# Patient Record
Sex: Male | Born: 2000
Health system: Southern US, Community
[De-identification: ages and names within clinical notes are randomized; demographics above are authoritative.]

## PROBLEM LIST (undated history)

## (undated) DIAGNOSIS — J45909 Unspecified asthma, uncomplicated: Secondary | ICD-10-CM

---
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
## 2018-02-07 ENCOUNTER — Encounter (HOSPITAL_COMMUNITY): Payer: Self-pay

## 2018-02-07 ENCOUNTER — Other Ambulatory Visit: Payer: Self-pay

## 2018-02-07 ENCOUNTER — Emergency Department (HOSPITAL_COMMUNITY)
Admission: EM | Admit: 2018-02-07 | Discharge: 2018-02-07 | Disposition: A | Discharge status: Home / Self Care | Payer: Medicaid Other | Attending: Emergency Medicine | Admitting: Emergency Medicine

## 2018-02-07 DIAGNOSIS — R1031 Right lower quadrant pain: Secondary | ICD-10-CM | POA: Insufficient documentation

## 2018-02-07 DIAGNOSIS — R109 Unspecified abdominal pain: Secondary | ICD-10-CM | POA: Diagnosis present

## 2018-02-07 DIAGNOSIS — J45909 Unspecified asthma, uncomplicated: Secondary | ICD-10-CM | POA: Diagnosis not present

## 2018-02-07 DIAGNOSIS — F17210 Nicotine dependence, cigarettes, uncomplicated: Secondary | ICD-10-CM | POA: Insufficient documentation

## 2018-02-07 DIAGNOSIS — Z79899 Other long term (current) drug therapy: Secondary | ICD-10-CM | POA: Insufficient documentation

## 2018-02-07 DIAGNOSIS — K5900 Constipation, unspecified: Secondary | ICD-10-CM | POA: Diagnosis not present

## 2018-02-07 HISTORY — DX: Unspecified asthma, uncomplicated: J45.909

## 2018-02-07 LAB — COMPREHENSIVE METABOLIC PANEL
ALT: 20 U/L (ref 0–44)
ANION GAP: 8 (ref 5–15)
AST: 18 U/L (ref 15–41)
Albumin: 4.2 g/dL (ref 3.5–5.0)
Alkaline Phosphatase: 56 U/L (ref 38–126)
BUN: 12 mg/dL (ref 6–20)
CO2: 26 mmol/L (ref 22–32)
Calcium: 9.1 mg/dL (ref 8.9–10.3)
Chloride: 106 mmol/L (ref 98–111)
Creatinine, Ser: 0.89 mg/dL (ref 0.61–1.24)
GFR calc Af Amer: >60 mL/min (ref >60)
Glucose, Bld: 94 mg/dL (ref 70–99)
Potassium: 4.1 mmol/L (ref 3.5–5.1)
Sodium: 140 mmol/L (ref 135–145)
TOTAL PROTEIN: 6.9 g/dL (ref 6.5–8.1)
Total Bilirubin: 0.8 mg/dL (ref 0.3–1.2)

## 2018-02-07 LAB — CBC
HEMATOCRIT: 43.3 % (ref 39.0–52.0)
Hemoglobin: 14.4 g/dL (ref 13.0–17.0)
MCH: 28.6 pg (ref 26.0–34.0)
MCHC: 33.3 g/dL (ref 30.0–36.0)
MCV: 86.1 fL (ref 78.0–100.0)
Platelets: 206 10*3/uL (ref 150–400)
RBC: 5.03 MIL/uL (ref 4.22–5.81)
RDW: 12.4 % (ref 11.5–15.5)
WBC: 10.8 10*3/uL — AB (ref 4.0–10.5)

## 2018-02-07 LAB — LIPASE, BLOOD: Lipase: 29 U/L (ref 11–51)

## 2018-02-07 MED ORDER — POLYETHYLENE GLYCOL 3350 17 G PO PACK: 17.0000 g | PACK | Freq: Every day | ORAL | 0 refills | Status: AC

## 2018-02-07 MED ORDER — POLYETHYLENE GLYCOL 3350 17 G PO PACK: 17.0000 g | PACK | Freq: Every day | ORAL | 0 refills | Status: DC

## 2018-02-07 NOTE — ED Triage Notes (Signed)
[facility] [place] [facility] [place] pain, abdominal pain, and fever. Pt reports he has been constipated X3 days with pain starting today.

## 2018-02-07 NOTE — ED Provider Notes (Signed)
[facility]   CSN: [id number] Arrival date & time: 02/07/18  1216     History   Chief Complaint Chief Complaint  Patient presents with  . Abdominal Pain  . Back Pain    HPI Edgar Walton is a 17 y.o. male.  HPI   17 year old male presents today with complaints of abdominal pain. Patient notes over the last 3 days he's been passing gas but has had very little bowel movements. He notes having a very small bowel movement yesterday. Patient notes he felt nauseous and vomited once. He denies any fever, notes the pain is bilateral lower. He denies any urinary symptoms. Prior to my evaluation patient reports that he had a large bowel movement and had significant improvement in his symptoms.    Past Medical History:  Diagnosis Date  . Asthma     There are no active problems to display for this patient.   History reviewed. No pertinent surgical history.      Home Medications    Prior to Admission medications   Medication Sig Start Date End Date Taking? Authorizing Provider  albuterol (PROAIR HFA) 108 (90 Base) MCG/ACT inhaler Inhale 1 puff into the lungs every 4 (four) hours as needed for wheezing or shortness of breath. 10/31/17  Yes [provider]  polyethylene glycol (MIRALAX) packet Take 17 g by mouth daily. 02/07/18   Eyvonne MechanicHedges, Nicholette Dolson, PA-C    Family History No family history on file.  Social History Social History   Tobacco Use  . Smoking status: Heavy Tobacco Smoker    Packs/day: 1.00    Types: Cigarettes  . Smokeless tobacco: Never Used  Substance Use Topics  . Alcohol use: No  . Drug use: No     Allergies   Bee venom; Morphine and related; Butterscotch flavor; and Penicillins   Review of Systems Review of Systems  All other systems reviewed and are negative.    Physical Exam Updated Vital Signs BP (!) 156/71   Pulse 89   Temp 98.9 F (37.2 C) (Oral)   Resp 18   Ht 6' (1.829 m)    Wt 95.7 kg (211 lb)   SpO2 100%   BMI 28.62 kg/m   Physical Exam  Constitutional: He is oriented to person, place, and time. He appears well-developed and well-nourished.  HENT:  Head: Normocephalic and atraumatic.  Eyes: Pupils are equal, round, and reactive to light. Conjunctivae are normal. Right eye exhibits no discharge. Left eye exhibits no discharge. No scleral icterus.  Neck: Normal range of motion. No JVD present. No tracheal deviation present.  Pulmonary/Chest: Effort normal. No stridor.  Abdominal:  Minor tenderness to palpation of the suprapubic and right lower quadrant remainder abdomen soft nontender  Neurological: He is alert and oriented to person, place, and time. Coordination normal.  Psychiatric: He has a normal mood and affect. His behavior is normal. Judgment and thought content normal.  Nursing note and vitals reviewed.    ED Treatments / Results  Labs (all labs ordered are listed, but only abnormal results are displayed) Labs Reviewed  CBC - Abnormal; Notable for the following components:      Result Value   WBC 10.8 (*)    All other components within normal limits  LIPASE, BLOOD  COMPREHENSIVE METABOLIC PANEL    EKG None  Radiology No results found.  Procedures Procedures (including critical care time)  Medications Ordered in ED Medications - No data to display   Initial  Impression / Assessment and Plan / ED Course  I have reviewed the triage vital signs and the nursing notes.  Pertinent labs & imaging results that were available during my care of the patient were reviewed by me and considered in my medical decision making (see chart for details).     17 year old male presents today with complaints of abdominal pain. Suspicion for constipation. Patient did have an episode of vomiting and does have very minimal tenderness to palpation of the lower abdomen. Patient had dramatic improvement in his symptoms after having a bowel movement, with  the nausea vomiting and pain recommend CT scan to rule out acute intra-abdominal pathology. Patient would like to leave and does not feel it is necessary. Patient does not have a fever or tachycardia or elevation white count.  he is stable for outpatient management, he'll return tomorrow if symptoms persist immediately if they worsen. He verbalized understanding and agreement to today's plan had no further questions or concerns  Final Clinical Impressions(s) / ED Diagnoses   Final diagnoses:  Right lower quadrant abdominal pain  Constipation, unspecified constipation type    ED Discharge Orders        Ordered    polyethylene glycol (MIRALAX) packet  Daily,   Status:  Discontinued     02/07/18 1428    polyethylene glycol (MIRALAX) packet  Daily     02/07/18 1430       Eyvonne Mechanic, PA-C 02/07/18 2130    Samuel Jester, DO 02/10/18 1523

## 2018-02-07 NOTE — Discharge Instructions (Addendum)
Please read the attached information, please return tomorrow if your symptoms persist or sooner if they worsen. Please drink plenty of water and take prescribed medication as directed.

## 2018-02-07 NOTE — ED Notes (Signed)
Discharge instructions discussed with Pt. Pt verbalized understanding. Pt stable and ambulatory.    

## 2018-02-27 ENCOUNTER — Emergency Department (HOSPITAL_COMMUNITY): Payer: Medicaid Other

## 2018-02-27 ENCOUNTER — Other Ambulatory Visit: Payer: Self-pay

## 2018-02-27 ENCOUNTER — Emergency Department (HOSPITAL_COMMUNITY)
Admission: EM | Admit: 2018-02-27 | Discharge: 2018-02-27 | Disposition: A | Discharge status: Home / Self Care | Payer: Medicaid Other | Attending: Emergency Medicine | Admitting: Emergency Medicine

## 2018-02-27 ENCOUNTER — Encounter (HOSPITAL_COMMUNITY): Payer: Self-pay | Admitting: *Deleted

## 2018-02-27 DIAGNOSIS — R1030 Lower abdominal pain, unspecified: Secondary | ICD-10-CM

## 2018-02-27 DIAGNOSIS — J45909 Unspecified asthma, uncomplicated: Secondary | ICD-10-CM | POA: Insufficient documentation

## 2018-02-27 DIAGNOSIS — K5900 Constipation, unspecified: Secondary | ICD-10-CM | POA: Insufficient documentation

## 2018-02-27 DIAGNOSIS — Z79899 Other long term (current) drug therapy: Secondary | ICD-10-CM | POA: Insufficient documentation

## 2018-02-27 DIAGNOSIS — F17210 Nicotine dependence, cigarettes, uncomplicated: Secondary | ICD-10-CM | POA: Insufficient documentation

## 2018-02-27 DIAGNOSIS — K5909 Other constipation: Secondary | ICD-10-CM

## 2018-02-27 LAB — COMPREHENSIVE METABOLIC PANEL
ALK PHOS: 49 U/L (ref 38–126)
ALT: 19 U/L (ref 0–44)
ANION GAP: 6 (ref 5–15)
AST: 18 U/L (ref 15–41)
Albumin: 4.3 g/dL (ref 3.5–5.0)
BUN: 13 mg/dL (ref 6–20)
CALCIUM: 9.2 mg/dL (ref 8.9–10.3)
CHLORIDE: 106 mmol/L (ref 98–111)
CO2: 28 mmol/L (ref 22–32)
Creatinine, Ser: 0.78 mg/dL (ref 0.61–1.24)
GFR calc non Af Amer: >60 mL/min (ref >60)
GLUCOSE: 110 mg/dL — AB (ref 70–99)
POTASSIUM: 3.8 mmol/L (ref 3.5–5.1)
SODIUM: 140 mmol/L (ref 135–145)
Total Bilirubin: 0.7 mg/dL (ref 0.3–1.2)
Total Protein: 7.1 g/dL (ref 6.5–8.1)

## 2018-02-27 LAB — URINALYSIS, ROUTINE W REFLEX MICROSCOPIC
BILIRUBIN URINE: NEGATIVE
Glucose, UA: NEGATIVE mg/dL
Hgb urine dipstick: NEGATIVE
Ketones, ur: NEGATIVE mg/dL
Leukocytes, UA: NEGATIVE
NITRITE: NEGATIVE
Protein, ur: NEGATIVE mg/dL
Specific Gravity, Urine: 1.027 (ref 1.005–1.030)
pH: 5 (ref 5.0–8.0)

## 2018-02-27 LAB — CBC WITH DIFFERENTIAL/PLATELET
BASOS PCT: 0 %
Basophils Absolute: 0 10*3/uL (ref 0.0–0.1)
EOS PCT: 3 %
Eosinophils Absolute: 0.2 10*3/uL (ref 0.0–0.7)
HCT: 39.2 % (ref 39.0–52.0)
HEMOGLOBIN: 13.5 g/dL (ref 13.0–17.0)
Lymphocytes Relative: 41 %
Lymphs Abs: 2.5 10*3/uL (ref 0.7–4.0)
MCH: 29.2 pg (ref 26.0–34.0)
MCHC: 34.4 g/dL (ref 30.0–36.0)
MCV: 84.8 fL (ref 78.0–100.0)
MONOS PCT: 7 %
Monocytes Absolute: 0.4 10*3/uL (ref 0.1–1.0)
NEUTROS PCT: 49 %
Neutro Abs: 3 10*3/uL (ref 1.7–7.7)
Platelets: 217 10*3/uL (ref 150–400)
RBC: 4.62 MIL/uL (ref 4.22–5.81)
RDW: 12.7 % (ref 11.5–15.5)
WBC: 6.1 10*3/uL (ref 4.0–10.5)

## 2018-02-27 LAB — LIPASE, BLOOD: Lipase: 27 U/L (ref 11–51)

## 2018-02-27 MED ORDER — PEG 3350-KCL-NABCB-NACL-NASULF 236 G PO SOLR: 4000.0000 mL | Freq: Once | ORAL | 0 refills | Status: AC

## 2018-02-27 NOTE — ED Provider Notes (Signed)
[facility] EMERGENCY DEPARTMENT Provider Note   CSN: [id number] Arrival date & time: 02/27/18  0123     History   Chief Complaint Chief Complaint  Patient presents with  . Abdominal Pain    HPI Edgar Walton is a 17 y.o. male.  HPI  This is a 17 year old male who presents with abdominal pain.  Patient was seen and evaluated on August 1 for the same.  He reports lower abdominal pain and pressure.  Mostly with bowel movements.  Patient does report that he has had bowel movements but has noticed that they are loose and "not normal."  He denies any bloody stools.  He has noted pressure in his rectum with stooling.  Currently his pain is 2 out of 10.  Denies any urinary symptoms, fevers, nausea, vomiting.  Patient reports that he took MiraLAX which initially seemed to help but he has not taken MiraLAX recently.  He has never had any problems with bowel movements in the past.  Past Medical History:  Diagnosis Date  . Asthma     There are no active problems to display for this patient.   History reviewed. No pertinent surgical history.      Home Medications    Prior to Admission medications   Medication Sig Start Date End Date Taking? Authorizing Provider  albuterol (PROAIR HFA) 108 (90 Base) MCG/ACT inhaler Inhale 1 puff into the lungs every 4 (four) hours as needed for wheezing or shortness of breath. 10/31/17   [provider]  polyethylene glycol (GOLYTELY) 236 g solution Take 4,000 mLs by mouth once for 1 dose. Take 8 oz every 15-30 min until finished 02/27/18 02/27/18  Elisheva Fallas, Mayer Maskerourtney F, MD  polyethylene glycol [facility](MIRALAX) packet Take 17 g by mouth daily. 02/07/18   Eyvonne MechanicHedges, Jeffrey, PA-C    Family History History reviewed. No pertinent family history.  Social History Social History   Tobacco Use  . Smoking status: Heavy Tobacco Smoker    Packs/day: 1.00    Types: Cigarettes  . Smokeless tobacco: Never Used  Substance Use Topics  . Alcohol use: No  .  Drug use: No     Allergies   Bee venom; Morphine and related; Butterscotch flavor; and Penicillins   Review of Systems Review of Systems  Constitutional: Negative for fever.  Respiratory: Negative for shortness of breath.   Cardiovascular: Negative for chest pain.  Gastrointestinal: Positive for abdominal pain. Negative for blood in stool, nausea and vomiting.  Genitourinary: Negative for dysuria.  All other systems reviewed and are negative.    Physical Exam Updated Vital Signs BP (!) 144/86 (BP Location: Right Arm)   Pulse 64   Temp 98.1 F (36.7 C) (Oral)   Resp 18   Ht 6' (1.829 m)   Wt 95.7 kg   SpO2 100%   BMI 28.62 kg/m   Physical Exam  Constitutional: He is oriented to person, place, and time. He appears well-developed and well-nourished. No distress.  HENT:  Head: Normocephalic and atraumatic.  Eyes: Pupils are equal, round, and reactive to light.  Neck: Neck supple.  Cardiovascular: Normal rate, regular rhythm and normal heart sounds.  No murmur heard. Pulmonary/Chest: Effort normal and breath sounds normal. No respiratory distress. He has no wheezes.  Abdominal: Soft. Bowel sounds are normal. There is no tenderness. There is no rebound and no guarding.  Genitourinary: Rectum normal. Rectal exam shows no mass and no tenderness.  Genitourinary Comments: No fecal impaction noted  Musculoskeletal: He exhibits no edema.  Lymphadenopathy:    He has no cervical adenopathy.  Neurological: He is alert and oriented to person, place, and time.  Skin: Skin is warm and dry.  Psychiatric: He has a normal mood and affect.  Nursing note and vitals reviewed.    ED Treatments / Results  Labs (all labs ordered are listed, but only abnormal results are displayed) Labs Reviewed  COMPREHENSIVE METABOLIC PANEL - Abnormal; Notable for the following components:      Result Value   Glucose, Bld 110 (*)    All other components within normal limits  CBC WITH  DIFFERENTIAL/PLATELET  LIPASE, BLOOD  URINALYSIS, ROUTINE W REFLEX MICROSCOPIC    EKG None  Radiology Dg Abdomen 1 View  Result Date: 02/27/2018 CLINICAL DATA:  Lower abdominal pain. EXAM: ABDOMEN - 1 VIEW COMPARISON:  None. FINDINGS: No bowel dilatation to suggest obstruction. Moderate stool in the right proximal transverse colon. Small to moderate stool in the distal descending colon. No abnormal rectal distention. No radiopaque calculi or abnormal soft tissue calcifications. No osseous abnormalities. IMPRESSION: Moderate colonic stool burden without bowel obstruction. Electronically Signed   By: Rubye OaksMelanie  Ehinger M.D.   On: 02/27/2018 02:19    Procedures Procedures (including critical care time)  Medications Ordered in ED Medications - No data to display   Initial Impression / Assessment and Plan / ED Course  I have reviewed the triage vital signs and the nursing notes.  Pertinent labs & imaging results that were available during my care of the patient were reviewed by me and considered in my medical decision making (see chart for details).     Patient presents with persistent lower abdominal discomfort and bowel changes.  He is overall nontoxic-appearing and vital signs are reassuring.  He was seen over 2 weeks ago but felt to be constipated.  He did try laxative once which seemed to help but has not been consistently use.  Abdominal exam is benign.  No focal tenderness.  Rectal exam without fecal impaction.  His KUB does show evidence of moderate stool burden.  Lab work repeated and reviewed.  Lab work-up is largely unremarkable.  Doubt appendicitis.  Patient does report some history of lactose intolerance but continues to eat cheese.  Recommend increasing fiber in diet.  Patient was offered options of increasing MiraLAX for cleanout versus GoLYTELY.  Patient chose GoLYTELY.  He was instructed on use to attempt full cleanout.  After history, exam, and medical workup I feel the  patient has been appropriately medically screened and is safe for discharge home. Pertinent diagnoses were discussed with the patient. Patient was given return precautions.   Final Clinical Impressions(s) / ED Diagnoses   Final diagnoses:  Other constipation  Lower abdominal pain    ED Discharge Orders         Ordered    polyethylene glycol (GOLYTELY) 236 g solution   Once     02/27/18 0315           Slyvia Lartigue, Mayer Maskerourtney F, MD 02/27/18 [phone]

## 2018-02-27 NOTE — ED Triage Notes (Signed)
Pt c/o abdominal pain with dark stools x 3-4 weeks; pt states he was seen at [facility] x 3 weeks ago for the same complaint; pt's last BM was today

## 2018-06-17 ENCOUNTER — Other Ambulatory Visit: Payer: Self-pay

## 2018-06-17 ENCOUNTER — Emergency Department (HOSPITAL_COMMUNITY)
Admission: EM | Admit: 2018-06-17 | Discharge: 2018-06-18 | Disposition: A | Discharge status: Home / Self Care | Payer: Medicaid Other | Attending: Emergency Medicine | Admitting: Emergency Medicine

## 2018-06-17 ENCOUNTER — Emergency Department (HOSPITAL_COMMUNITY): Payer: Medicaid Other

## 2018-06-17 ENCOUNTER — Encounter (HOSPITAL_COMMUNITY): Payer: Self-pay | Admitting: Emergency Medicine

## 2018-06-17 DIAGNOSIS — M25561 Pain in right knee: Secondary | ICD-10-CM | POA: Insufficient documentation

## 2018-06-17 DIAGNOSIS — J45909 Unspecified asthma, uncomplicated: Secondary | ICD-10-CM | POA: Insufficient documentation

## 2018-06-17 DIAGNOSIS — F17210 Nicotine dependence, cigarettes, uncomplicated: Secondary | ICD-10-CM | POA: Insufficient documentation

## 2018-06-17 DIAGNOSIS — Z79899 Other long term (current) drug therapy: Secondary | ICD-10-CM | POA: Insufficient documentation

## 2018-06-17 NOTE — ED Triage Notes (Signed)
Pt reports rt knee pain for the last few weeks intermittently. Pt ambulatory. Denies any injuries.

## 2018-06-18 MED ORDER — NAPROXEN 500 MG PO TABS: 500.0000 mg | ORAL_TABLET | Freq: Two times a day (BID) | ORAL | 0 refills | Status: AC

## 2018-06-18 MED ORDER — NAPROXEN 500 MG PO TABS: 500.0000 mg | ORAL_TABLET | Freq: Two times a day (BID) | ORAL | 0 refills | Status: DC

## 2018-06-18 MED ORDER — NAPROXEN 250 MG PO TABS
500.0000 mg | ORAL_TABLET | Freq: Once | ORAL | Status: AC
  Administered 2018-06-18: 500 mg via ORAL
  Filled 2018-06-18: qty 2

## 2018-06-18 NOTE — ED Notes (Signed)
Patient verbalizes understanding of medications and discharge instructions. No further questions at this time. VSS and patient ambulatory at discharge.   

## 2018-06-18 NOTE — ED Provider Notes (Signed)
[facility] Provider Note   CSN: [id number] Arrival date & time: 06/17/18  2312     History   Chief Complaint Chief Complaint  Patient presents with  . Knee Pain    HPI Edgar Walton is a 17 y.o. male.  The history is provided by the patient. No language interpreter was used.  Knee Pain   This is a new problem. Episode onset: 2 weeks ago. The problem occurs constantly. The problem has not changed since onset.The pain is present in the right knee. The quality of the pain is described as aching and sharp. The pain is moderate. Pertinent negatives include no tingling. Exacerbated by: ambulation. Treatments tried: tylenol, ibuprofen. The treatment provided mild relief. There has been no history of extremity trauma.    Past Medical History:  Diagnosis Date  . Asthma     There are no active problems to display for this patient.   History reviewed. No pertinent surgical history.      Home Medications    Prior to Admission medications   Medication Sig Start Date End Date Taking? Authorizing Provider  albuterol (PROAIR HFA) 108 (90 Base) MCG/ACT inhaler Inhale 1 puff into the lungs every 4 (four) hours as needed for wheezing or shortness of breath. 10/31/17   [provider]  naproxen (NAPROSYN) 500 MG tablet Take 1 tablet (500 mg total) by mouth 2 (two) times daily. 06/18/18   Antony MaduraHumes, Louis Ivery, PA-C  polyethylene glycol [facility](MIRALAX) packet Take 17 g by mouth daily. 02/07/18   Eyvonne MechanicHedges, Jeffrey, PA-C    Family History No family history on file.  Social History Social History   Tobacco Use  . Smoking status: Heavy Tobacco Smoker    Packs/day: 0.50    Types: Cigarettes  . Smokeless tobacco: Never Used  Substance Use Topics  . Alcohol use: Yes    Comment: occ  . Drug use: No     Allergies   Bee venom; Morphine and related; Butterscotch flavor; and Penicillins   Review of Systems Review of Systems  Musculoskeletal:  Positive for arthralgias.  Neurological: Negative for tingling.  Ten systems reviewed and are negative for acute change, except as noted in the HPI.    Physical Exam Updated Vital Signs BP 140/89 (BP Location: Right Arm)   Pulse 75   Temp 98.4 F (36.9 C) (Oral)   Resp 17   Ht 6' (1.829 m)   Wt 95.3 kg   SpO2 100%   BMI 28.48 kg/m   Physical Exam  Constitutional: He is oriented to person, place, and time. He appears well-developed and well-nourished. No distress.  Nontoxic appearing and in NAD  HENT:  Head: Normocephalic and atraumatic.  Eyes: Conjunctivae and EOM are normal. No scleral icterus.  Neck: Normal range of motion.  Cardiovascular: Normal rate, regular rhythm and intact distal pulses.  DP pulse 2+ in the RLE  Pulmonary/Chest: Effort normal. No respiratory distress.  Respirations even and unlabored  Musculoskeletal: Normal range of motion.  TTP to the right knee posteriorly and along bilateral joint lines. Normal flexion and extension of the R knee without bony deformity or crepitus. No erythema, swelling, heat to touch.  Neurological: He is alert and oriented to person, place, and time. He exhibits normal muscle tone. Coordination normal.  Sensation to light touch intact.  Skin: Skin is warm and dry. No rash noted. He is not diaphoretic. No erythema. No pallor.  Psychiatric: He has a normal mood and affect. His behavior  is normal.  Nursing note and vitals reviewed.    ED Treatments / Results  Labs (all labs ordered are listed, but only abnormal results are displayed) Labs Reviewed - No data to display  EKG None  Radiology Dg Knee Complete 4 Views Right  Result Date: 06/17/2018 CLINICAL DATA:  Intermittent right knee pain for the past 2 weeks. No known injury. EXAM: RIGHT KNEE - COMPLETE 4+ VIEW COMPARISON:  None. FINDINGS: No evidence of fracture, dislocation, or joint effusion. No evidence of arthropathy or other focal bone abnormality. Soft tissues are  unremarkable. IMPRESSION: Normal examination. Electronically Signed   By: Beckie Salts M.D.   On: 06/17/2018 23:48    Procedures Procedures (including critical care time)  Medications Ordered in ED Medications  naproxen (NAPROSYN) tablet 500 mg (500 mg Oral Given 06/18/18 0159)     Initial Impression / Assessment and Plan / ED Course  I have reviewed the triage vital signs and the nursing notes.  Pertinent labs & imaging results that were available during my care of the patient were reviewed by me and considered in my medical decision making (see chart for details).     Patient presents to the emergency department for evaluation of R knee pain. Patient neurovascularly intact on exam. Imaging negative for fracture, dislocation, bony deformity. No swelling, erythema, heat to touch to the affected area; no concern for septic joint. Compartments in the affected extremity are soft. Plan for supportive management including RICE and NSAIDs; primary care follow up as needed. Return precautions discussed and provided. Patient discharged in stable condition with no unaddressed concerns.   Final Clinical Impressions(s) / ED Diagnoses   Final diagnoses:  Acute pain of right knee    ED Discharge Orders         Ordered    naproxen (NAPROSYN) 500 MG tablet  2 times daily     06/18/18 0155           Antony Madura, PA-C 06/18/18 3244    Zadie Rhine, MD 06/18/18 [phone]

## 2018-08-08 ENCOUNTER — Emergency Department (HOSPITAL_COMMUNITY): Payer: Medicaid Other

## 2018-08-08 ENCOUNTER — Other Ambulatory Visit: Payer: Self-pay

## 2018-08-08 ENCOUNTER — Encounter (HOSPITAL_COMMUNITY): Payer: Self-pay | Admitting: Emergency Medicine

## 2018-08-08 ENCOUNTER — Emergency Department (HOSPITAL_COMMUNITY)
Admission: EM | Admit: 2018-08-08 | Discharge: 2018-08-09 | Disposition: A | Discharge status: Home / Self Care | Payer: Medicaid Other | Attending: Emergency Medicine | Admitting: Emergency Medicine

## 2018-08-08 DIAGNOSIS — J069 Acute upper respiratory infection, unspecified: Secondary | ICD-10-CM

## 2018-08-08 DIAGNOSIS — R0789 Other chest pain: Secondary | ICD-10-CM | POA: Insufficient documentation

## 2018-08-08 DIAGNOSIS — F17210 Nicotine dependence, cigarettes, uncomplicated: Secondary | ICD-10-CM | POA: Insufficient documentation

## 2018-08-08 LAB — BASIC METABOLIC PANEL
Anion gap: 9 (ref 5–15)
BUN: 13 mg/dL (ref 6–20)
CO2: 25 mmol/L (ref 22–32)
Calcium: 9.7 mg/dL (ref 8.9–10.3)
Chloride: 105 mmol/L (ref 98–111)
Creatinine, Ser: 0.79 mg/dL (ref 0.61–1.24)
GFR calc Af Amer: >60 mL/min (ref >60)
Glucose, Bld: 116 mg/dL — ABNORMAL HIGH (ref 70–99)
Potassium: 3.5 mmol/L (ref 3.5–5.1)
SODIUM: 139 mmol/L (ref 135–145)

## 2018-08-08 LAB — CBC
HCT: 42.8 % (ref 39.0–52.0)
HEMOGLOBIN: 14.4 g/dL (ref 13.0–17.0)
MCH: 27.8 pg (ref 26.0–34.0)
MCHC: 33.6 g/dL (ref 30.0–36.0)
MCV: 82.6 fL (ref 80.0–100.0)
Platelets: 333 10*3/uL (ref 150–400)
RBC: 5.18 MIL/uL (ref 4.22–5.81)
RDW: 12.8 % (ref 11.5–15.5)
WBC: 7.3 10*3/uL (ref 4.0–10.5)
nRBC: 0 % (ref 0.0–0.2)

## 2018-08-08 LAB — I-STAT TROPONIN, ED: Troponin i, poc: 0 ng/mL (ref 0.00–0.08)

## 2018-08-08 MED ORDER — SODIUM CHLORIDE 0.9% FLUSH: 3.0000 mL | Freq: Once | INTRAVENOUS | Status: DC

## 2018-08-08 NOTE — ED Triage Notes (Signed)
Pt here for chest pains, pt has had URI symptoms and coughing up blood for 1 week.

## 2018-08-09 MED ORDER — BENZONATATE 100 MG PO CAPS: 200.0000 mg | ORAL_CAPSULE | Freq: Two times a day (BID) | ORAL | 0 refills | Status: AC | PRN

## 2018-08-09 NOTE — ED Provider Notes (Signed)
[facility] EMERGENCY DEPARTMENT Provider Note   CSN: [id number] Arrival date & time: 08/08/18  1857     History   Chief Complaint Chief Complaint  Patient presents with  . Chest Pain    HPI Edgar Walton is a 18 y.o. male.  Patient with past medical history remarkable for asthma presents to the emergency department with a chief complaint of cough.  He states that he has had some blood-tinged sputum.  Reports having frequent coughing at night.  Denies any fevers chills.  States that he has been sick for about 1 week.  He has been using an inhaler and nebulizer as needed.  He has tried OTC cough and cold medications with little relief.  He denies any prior history of PE or DVT.  Denies any recent surgery or immobilization.  Denies any leg swelling or calf pain.  He states that the coughing causes his chest to hurt, and keeps him awake at night.  The history is provided by the patient. No language interpreter was used.    Past Medical History:  Diagnosis Date  . Asthma     There are no active problems to display for this patient.   History reviewed. No pertinent surgical history.      Home Medications    Prior to Admission medications   Medication Sig Start Date End Date Taking? Authorizing Provider  albuterol (PROAIR HFA) 108 (90 Base) MCG/ACT inhaler Inhale 1 puff into the lungs every 4 (four) hours as needed for wheezing or shortness of breath. 10/31/17   [provider]  benzonatate (TESSALON) 100 MG capsule Take 2 capsules (200 mg total) by mouth 2 (two) times daily as needed for cough. 08/09/18   Roxy Horseman, PA-C  naproxen (NAPROSYN) 500 MG tablet Take 1 tablet (500 mg total) by mouth 2 (two) times daily. 06/18/18   Antony Madura, PA-C  polyethylene glycol [facility]) packet Take 17 g by mouth daily. 02/07/18   Eyvonne Mechanic, PA-C    Family History No family history on file.  Social History Social History   Tobacco Use  .  Smoking status: Heavy Tobacco Smoker    Packs/day: 0.50    Types: Cigarettes  . Smokeless tobacco: Never Used  Substance Use Topics  . Alcohol use: Yes    Comment: occ  . Drug use: No     Allergies   Bee venom; Morphine and related; Butterscotch flavor; and Penicillins   Review of Systems Review of Systems  All other systems reviewed and are negative.    Physical Exam Updated Vital Signs BP 131/90   Pulse 77   Temp 98.7 F (37.1 C) (Oral)   Resp (!) 23   SpO2 98%   Physical Exam Vitals signs and nursing note reviewed.  Constitutional:      Appearance: He is well-developed.  HENT:     Head: Normocephalic and atraumatic.  Eyes:     General: No scleral icterus.       Right eye: No discharge.        Left eye: No discharge.     Conjunctiva/sclera: Conjunctivae normal.     Pupils: Pupils are equal, round, and reactive to light.  Neck:     Musculoskeletal: Normal range of motion and neck supple.     Vascular: No JVD.  Cardiovascular:     Rate and Rhythm: Normal rate and regular rhythm.     Heart sounds: Normal heart sounds. No murmur. No friction rub. No gallop.  Pulmonary:     Effort: Pulmonary effort is normal. No respiratory distress.     Breath sounds: Normal breath sounds. No wheezing or rales.     Comments: CTAB Chest:     Chest wall: No tenderness.  Abdominal:     General: There is no distension.     Palpations: Abdomen is soft. There is no mass.     Tenderness: There is no abdominal tenderness. There is no guarding or rebound.  Musculoskeletal: Normal range of motion.        General: No tenderness.  Skin:    General: Skin is warm and dry.  Neurological:     Mental Status: He is alert and oriented to person, place, and time.  Psychiatric:        Behavior: Behavior normal.        Thought Content: Thought content normal.        Judgment: Judgment normal.      ED Treatments / Results  Labs (all labs ordered are listed, but only abnormal results  are displayed) Labs Reviewed  BASIC METABOLIC PANEL - Abnormal; Notable for the following components:      Result Value   Glucose, Bld 116 (*)    All other components within normal limits  CBC  I-STAT TROPONIN, ED    EKG EKG Interpretation  Date/Time:  Thursday August 08 2018 19:06:01 EST Ventricular Rate:  90 PR Interval:  156 QRS Duration: 64 QT Interval:  322 QTC Calculation: 393 R Axis:   60 Text Interpretation:  Normal sinus rhythm with sinus arrhythmia Confirmed by Palumbo, April ([id number]) on 08/08/2018 11:20:36 PM   Radiology Dg Chest 2 View  Result Date: 08/08/2018 CLINICAL DATA:  Central chest pain and dry cough for 1 week. Hemoptysis and nose bleed yesterday. Smoker. EXAM: CHEST - 2 VIEW COMPARISON:  None. FINDINGS: The heart size and mediastinal contours are within normal limits. Both lungs are clear. The visualized skeletal structures are unremarkable. IMPRESSION: No active cardiopulmonary disease. Electronically Signed   By: Burman NievesWilliam  Stevens M.D.   On: 08/08/2018 20:20    Procedures Procedures (including critical care time)  Medications Ordered in ED Medications  sodium chloride flush (NS) 0.9 % injection 3 mL (has no administration in time range)     Initial Impression / Assessment and Plan / ED Course  I have reviewed the triage vital signs and the nursing notes.  Pertinent labs & imaging results that were available during my care of the patient were reviewed by me and considered in my medical decision making (see chart for details).     Pt CXR negative for acute infiltrate. Patients symptoms are consistent with URI, likely viral etiology. Discussed that antibiotics are not indicated for viral infections. Pt will be discharged with symptomatic treatment.  Verbalizes understanding and is agreeable with plan. Pt is hemodynamically stable & in NAD prior to dc.   Final Clinical Impressions(s) / ED Diagnoses   Final diagnoses:  Viral upper respiratory tract  infection    ED Discharge Orders         Ordered    benzonatate (TESSALON) 100 MG capsule  2 times daily PRN     08/09/18 0015           Roxy HorsemanBrowning, Elliott Lasecki, PA-C 08/09/18 0025    Palumbo, April, MD 08/09/18 0116

## 2019-07-16 ENCOUNTER — Ambulatory Visit: Payer: Medicaid Other | Attending: Internal Medicine

## 2019-07-16 ENCOUNTER — Other Ambulatory Visit: Payer: Self-pay

## 2019-07-16 DIAGNOSIS — Z20822 Contact with and (suspected) exposure to covid-19: Secondary | ICD-10-CM

## 2019-07-18 LAB — NOVEL CORONAVIRUS, NAA: SARS-CoV-2, NAA: NOT DETECTED

## 2019-07-28 ENCOUNTER — Other Ambulatory Visit: Payer: Medicaid Other

## 2019-07-29 ENCOUNTER — Ambulatory Visit: Payer: Medicaid Other | Attending: Internal Medicine

## 2019-07-29 ENCOUNTER — Other Ambulatory Visit: Payer: Self-pay

## 2019-07-29 DIAGNOSIS — Z20822 Contact with and (suspected) exposure to covid-19: Secondary | ICD-10-CM

## 2019-07-30 LAB — NOVEL CORONAVIRUS, NAA: SARS-CoV-2, NAA: NOT DETECTED

## 2019-09-14 ENCOUNTER — Emergency Department (HOSPITAL_COMMUNITY)
Admission: EM | Admit: 2019-09-14 | Discharge: 2019-09-14 | Disposition: A | Discharge status: Home / Self Care | Payer: Medicaid Other | Attending: Emergency Medicine | Admitting: Emergency Medicine

## 2019-09-14 ENCOUNTER — Encounter (HOSPITAL_COMMUNITY): Payer: Self-pay | Admitting: Emergency Medicine

## 2019-09-14 ENCOUNTER — Other Ambulatory Visit: Payer: Self-pay

## 2019-09-14 ENCOUNTER — Emergency Department (HOSPITAL_COMMUNITY): Payer: Medicaid Other

## 2019-09-14 DIAGNOSIS — F17210 Nicotine dependence, cigarettes, uncomplicated: Secondary | ICD-10-CM | POA: Insufficient documentation

## 2019-09-14 DIAGNOSIS — R0789 Other chest pain: Secondary | ICD-10-CM | POA: Insufficient documentation

## 2019-09-14 LAB — CBC
HCT: 48.3 % (ref 39.0–52.0)
Hemoglobin: 16.6 g/dL (ref 13.0–17.0)
MCH: 28.8 pg (ref 26.0–34.0)
MCHC: 34.4 g/dL (ref 30.0–36.0)
MCV: 83.7 fL (ref 80.0–100.0)
Platelets: 282 10*3/uL (ref 150–400)
RBC: 5.77 MIL/uL (ref 4.22–5.81)
RDW: 13.1 % (ref 11.5–15.5)
WBC: 7 10*3/uL (ref 4.0–10.5)
nRBC: 0 % (ref 0.0–0.2)

## 2019-09-14 LAB — BASIC METABOLIC PANEL
Anion gap: 11 (ref 5–15)
BUN: 10 mg/dL (ref 6–20)
CO2: 26 mmol/L (ref 22–32)
Calcium: 9.7 mg/dL (ref 8.9–10.3)
Chloride: 103 mmol/L (ref 98–111)
Creatinine, Ser: 0.82 mg/dL (ref 0.61–1.24)
GFR calc Af Amer: >60 mL/min (ref >60)
GFR calc non Af Amer: >60 mL/min (ref >60)
Glucose, Bld: 103 mg/dL — ABNORMAL HIGH (ref 70–99)
Potassium: 4.2 mmol/L (ref 3.5–5.1)
Sodium: 140 mmol/L (ref 135–145)

## 2019-09-14 LAB — TROPONIN I (HIGH SENSITIVITY): Troponin I (High Sensitivity): <2 ng/L (ref <18)

## 2019-09-14 MED ORDER — IBUPROFEN 800 MG PO TABS
800.0000 mg | ORAL_TABLET | Freq: Once | ORAL | Status: AC
  Administered 2019-09-14: 800 mg via ORAL
  Filled 2019-09-14: qty 1

## 2019-09-14 MED ORDER — SODIUM CHLORIDE 0.9% FLUSH: 3.0000 mL | Freq: Once | INTRAVENOUS | Status: DC

## 2019-09-14 NOTE — ED Triage Notes (Signed)
Pt reports left sided sharp chest pain x2-3 days, denies sob, states he took tylenol pm yesterday but woke up a few hours later with continued pain. Denies any medical hx other than asthma. resp e/u, nad.

## 2019-09-14 NOTE — ED Provider Notes (Signed)
[facility] EMERGENCY DEPARTMENT Provider Note   CSN: [id number] Arrival date & time: 09/14/19  1208     History Chief Complaint  Patient presents with  . Chest Pain    Edgar Walton is a 19 y.o. male.  The history is provided by the patient.  Chest Pain Pain location:  L chest Pain quality: aching   Pain radiates to:  Does not radiate Pain severity:  Mild Onset quality:  Gradual Duration:  3 days Timing:  Intermittent Progression:  Waxing and waning Chronicity:  New Context: raising an arm   Relieved by:  None tried Worsened by:  Movement Associated symptoms: no abdominal pain, no back pain, no claudication, no cough, no fever, no palpitations, no shortness of breath and no vomiting   Risk factors: no high cholesterol and no hypertension        Past Medical History:  Diagnosis Date  . Asthma     There are no problems to display for this patient.   History reviewed. No pertinent surgical history.     No family history on file.  Social History   Tobacco Use  . Smoking status: Heavy Tobacco Smoker    Packs/day: 0.50    Types: Cigarettes  . Smokeless tobacco: Never Used  Substance Use Topics  . Alcohol use: Yes    Comment: occ  . Drug use: No    Home Medications Prior to Admission medications   Medication Sig Start Date End Date Taking? Authorizing Provider  albuterol (PROAIR HFA) 108 (90 Base) MCG/ACT inhaler Inhale 1 puff into the lungs every 4 (four) hours as needed for wheezing or shortness of breath. 10/31/17   [provider]  benzonatate (TESSALON) 100 MG capsule Take 2 capsules (200 mg total) by mouth 2 (two) times daily as needed for cough. 08/09/18   Roxy Horseman, PA-C  naproxen (NAPROSYN) 500 MG tablet Take 1 tablet (500 mg total) by mouth 2 (two) times daily. 06/18/18   Antony Madura, PA-C  polyethylene glycol [facility]) packet Take 17 g by mouth daily. 02/07/18   Hedges, Tinnie Gens, PA-C    Allergies    Bee  venom, Morphine and related, Butterscotch flavor, and Penicillins  Review of Systems   Review of Systems  Constitutional: Negative for chills and fever.  HENT: Negative for ear pain and sore throat.   Eyes: Negative for pain and visual disturbance.  Respiratory: Negative for cough and shortness of breath.   Cardiovascular: Positive for chest pain. Negative for palpitations and claudication.  Gastrointestinal: Negative for abdominal pain and vomiting.  Genitourinary: Negative for dysuria and hematuria.  Musculoskeletal: Negative for arthralgias and back pain.  Skin: Negative for color change and rash.  Neurological: Negative for seizures and syncope.  All other systems reviewed and are negative.   Physical Exam Updated Vital Signs  ED Triage Vitals [09/14/19 1213]  Enc Vitals Group     BP (!) 140/110     Pulse Rate (!) 111     Resp 20     Temp 97.8 F (36.6 C)     Temp Source Oral     SpO2 100 %     Weight 240 lb (108.9 kg)     Height 6' (1.829 m)     Head Circumference      Peak Flow      Pain Score      Pain Loc      Pain Edu?      Excl. in GC?  Physical Exam Vitals and nursing note reviewed.  Constitutional:      Appearance: He is well-developed.  HENT:     Head: Normocephalic and atraumatic.  Eyes:     Conjunctiva/sclera: Conjunctivae normal.  Cardiovascular:     Rate and Rhythm: Normal rate and regular rhythm.     Pulses:          Radial pulses are 2+ on the right side and 2+ on the left side.     Heart sounds: Normal heart sounds. No murmur.  Pulmonary:     Effort: Pulmonary effort is normal. No respiratory distress.     Breath sounds: Normal breath sounds. No decreased breath sounds, wheezing, rhonchi or rales.  Chest:     Chest wall: Tenderness present.  Abdominal:     Palpations: Abdomen is soft.     Tenderness: There is no abdominal tenderness.  Musculoskeletal:        General: Normal range of motion.     Cervical back: Normal range of motion  and neck supple.     Right lower leg: No edema.     Left lower leg: No edema.  Skin:    General: Skin is warm and dry.     Capillary Refill: Capillary refill takes less than 2 seconds.  Neurological:     General: No focal deficit present.     Mental Status: He is alert.     ED Results / Procedures / Treatments   Labs (all labs ordered are listed, but only abnormal results are displayed) Labs Reviewed  BASIC METABOLIC PANEL - Abnormal; Notable for the following components:      Result Value   Glucose, Bld 103 (*)    All other components within normal limits  CBC  TROPONIN I (HIGH SENSITIVITY)    EKG EKG Interpretation  Date/Time:  Sunday September 14 2019 12:11:21 EST Ventricular Rate:  103 PR Interval:  138 QRS Duration: 76 QT Interval:  304 QTC Calculation: 398 R Axis:   22 Text Interpretation: Sinus tachycardia Cannot rule out Inferior infarct , age undetermined Abnormal ECG Confirmed by Lennice Sites [phone]) on 09/14/2019 12:21:15 PM   Radiology DG Chest Portable 1 View  Result Date: 09/14/2019 CLINICAL DATA:  Pt reports pain in the center of his chest. No history of heart or lung problems. Pt stated no SOB or difficulty with inspiration. EXAM: PORTABLE CHEST 1 VIEW COMPARISON:  Chest radiograph 08/08/2018 FINDINGS: The heart size and mediastinal contours are within normal limits. Low volumes with minimal linear opacities at the left base likely atelectasis. Right lung is clear. No pneumothorax or significant pleural effusion. No acute finding in the visualized skeleton. IMPRESSION: Low volumes with minimal linear opacities at the left base likely atelectasis. Electronically Signed   By: Audie Pinto M.D.   On: 09/14/2019 13:27    Procedures Procedures (including critical care time)  Medications Ordered in ED Medications  sodium chloride flush (NS) 0.9 % injection 3 mL (has no administration in time range)  ibuprofen (ADVIL) tablet 800 mg (has no administration in  time range)    ED Course  I have reviewed the triage vital signs and the nursing notes.  Pertinent labs & imaging results that were available during my care of the patient were reviewed by me and considered in my medical decision making (see chart for details).    MDM Rules/Calculators/A&P  Edgar Walton is a 18 year old male with no significant medical history who presents to the ED with left-sided chest pain.  Patient with normal vitals except for mild tachycardia.  Otherwise no fever.  Patient works [employer] job.  Left-sided chest pain with worsening pain with movement of his arm.  Reproducible tenderness on exam.  Lab work had already been ordered prior to my evaluation.  Troponin normal.  No significant anemia, electrolyte abnormality, kidney injury.  Patient denies cocaine use.  No recent illness.  Chest x-ray with no signs of pneumothorax, no pleural effusion, no pneumonia.  Patient has had no fever.  Doubt coronavirus.  Likely musculoskeletal type pain.  Recommend Motrin.  No cardiac risk factors.  No PE risk factors.  Given reassurance and discharged from the ED in good condition.  Given return precautions.  This chart was dictated using voice recognition software.  Despite best efforts to proofread,  errors can occur which can change the documentation meaning.    Final Clinical Impression(s) / ED Diagnoses Final diagnoses:  Chest wall pain    Rx / DC Orders ED Discharge Orders    None       Virgina Norfolk, DO 09/14/19 1355

## 2019-09-14 NOTE — ED Notes (Signed)
Patient given discharge instructions patient verbalizes understanding. 

## 2019-09-14 NOTE — Discharge Instructions (Signed)
Take 600 mg of Motrin every 8 hours for the next 5 days

## 2019-11-07 ENCOUNTER — Other Ambulatory Visit: Payer: Self-pay

## 2019-11-07 ENCOUNTER — Ambulatory Visit: Payer: Medicaid Other | Attending: Internal Medicine

## 2019-11-07 DIAGNOSIS — Z20822 Contact with and (suspected) exposure to covid-19: Secondary | ICD-10-CM

## 2019-11-08 LAB — SARS-COV-2, NAA 2 DAY TAT

## 2019-11-08 LAB — NOVEL CORONAVIRUS, NAA: SARS-CoV-2, NAA: NOT DETECTED

## 2020-04-01 IMAGING — DX DG FINGER LITTLE 2+V*R*
3 series · 3 of 3 positions shown · non-contrast
Comparison: None.

CLINICAL DATA: Dropped piece of metal onto the fifth finger with
pain and swelling

EXAM:
RIGHT LITTLE FINGER 2+V

[finger ap]
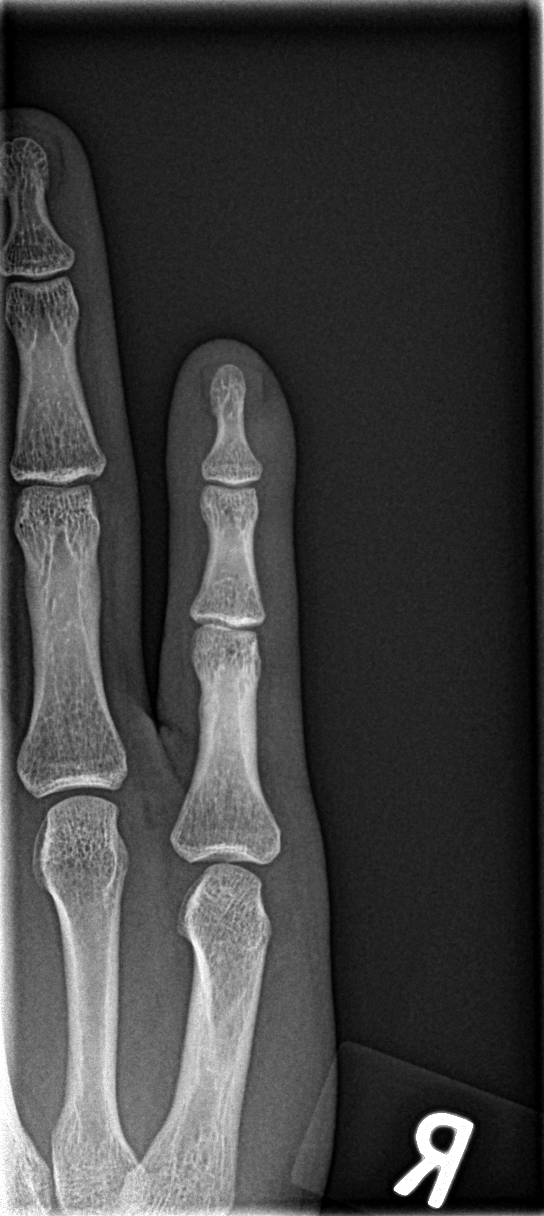

[finger obl]
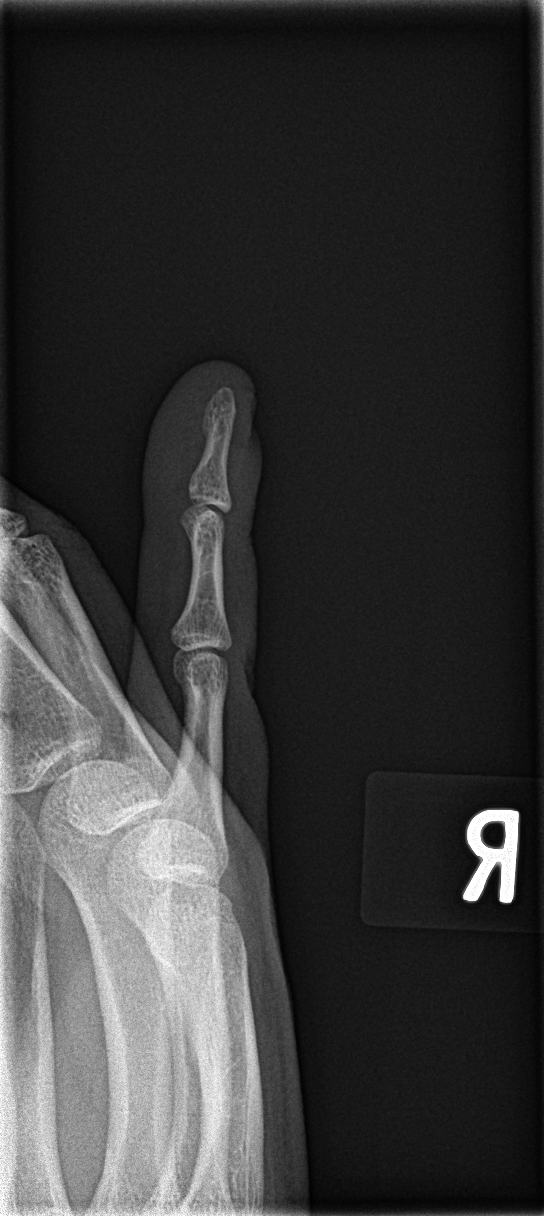

[finger lat]
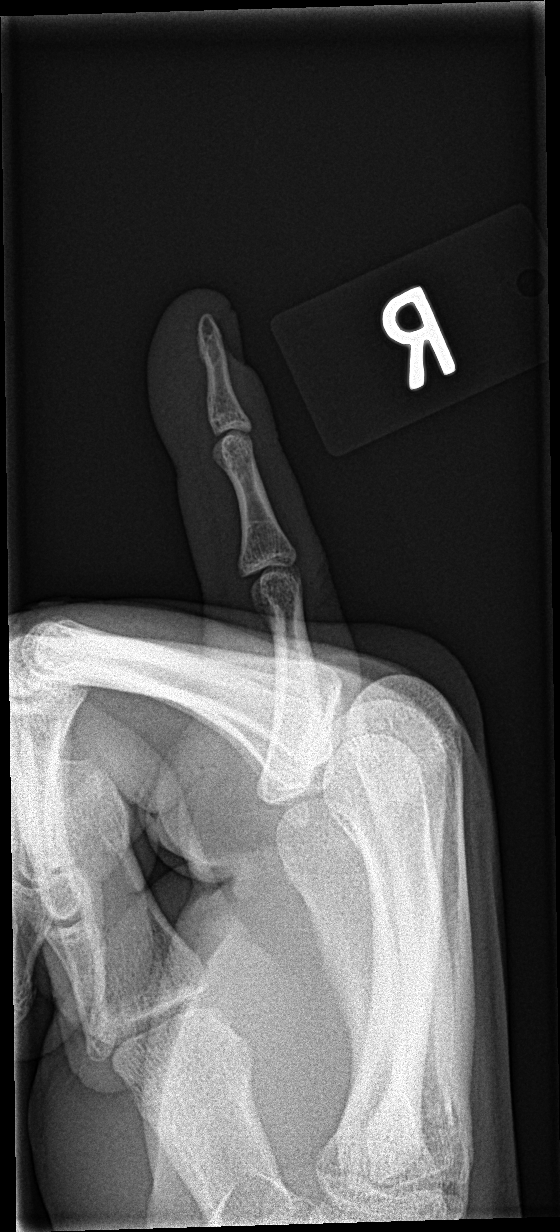

[3 of 3 positions shown; findings below may reference images not displayed]

FINDINGS: Alignment is normal. No fracture is seen. Joint spaces appear
normal.
IMPRESSION: Negative.

## 2020-07-14 ENCOUNTER — Other Ambulatory Visit: Payer: Self-pay

## 2020-07-14 ENCOUNTER — Other Ambulatory Visit: Payer: Medicaid Other

## 2020-07-14 DIAGNOSIS — Z20822 Contact with and (suspected) exposure to covid-19: Secondary | ICD-10-CM

## 2020-07-17 LAB — NOVEL CORONAVIRUS, NAA

## 2020-07-19 ENCOUNTER — Other Ambulatory Visit: Payer: Medicaid Other

## 2020-07-19 DIAGNOSIS — Z20822 Contact with and (suspected) exposure to covid-19: Secondary | ICD-10-CM

## 2020-07-20 LAB — SARS-COV-2, NAA 2 DAY TAT

## 2020-07-20 LAB — NOVEL CORONAVIRUS, NAA: SARS-CoV-2, NAA: NOT DETECTED

## 2020-08-23 ENCOUNTER — Other Ambulatory Visit: Payer: Medicaid Other

## 2021-01-01 IMAGING — DX DG CHEST 2V
2 series · 2 of 2 positions shown · non-contrast
Comparison: None.

CLINICAL DATA: Central chest pain and dry cough for 1 week.
Hemoptysis and nose bleed yesterday. Smoker.

EXAM:
CHEST - 2 VIEW

[chest pa]
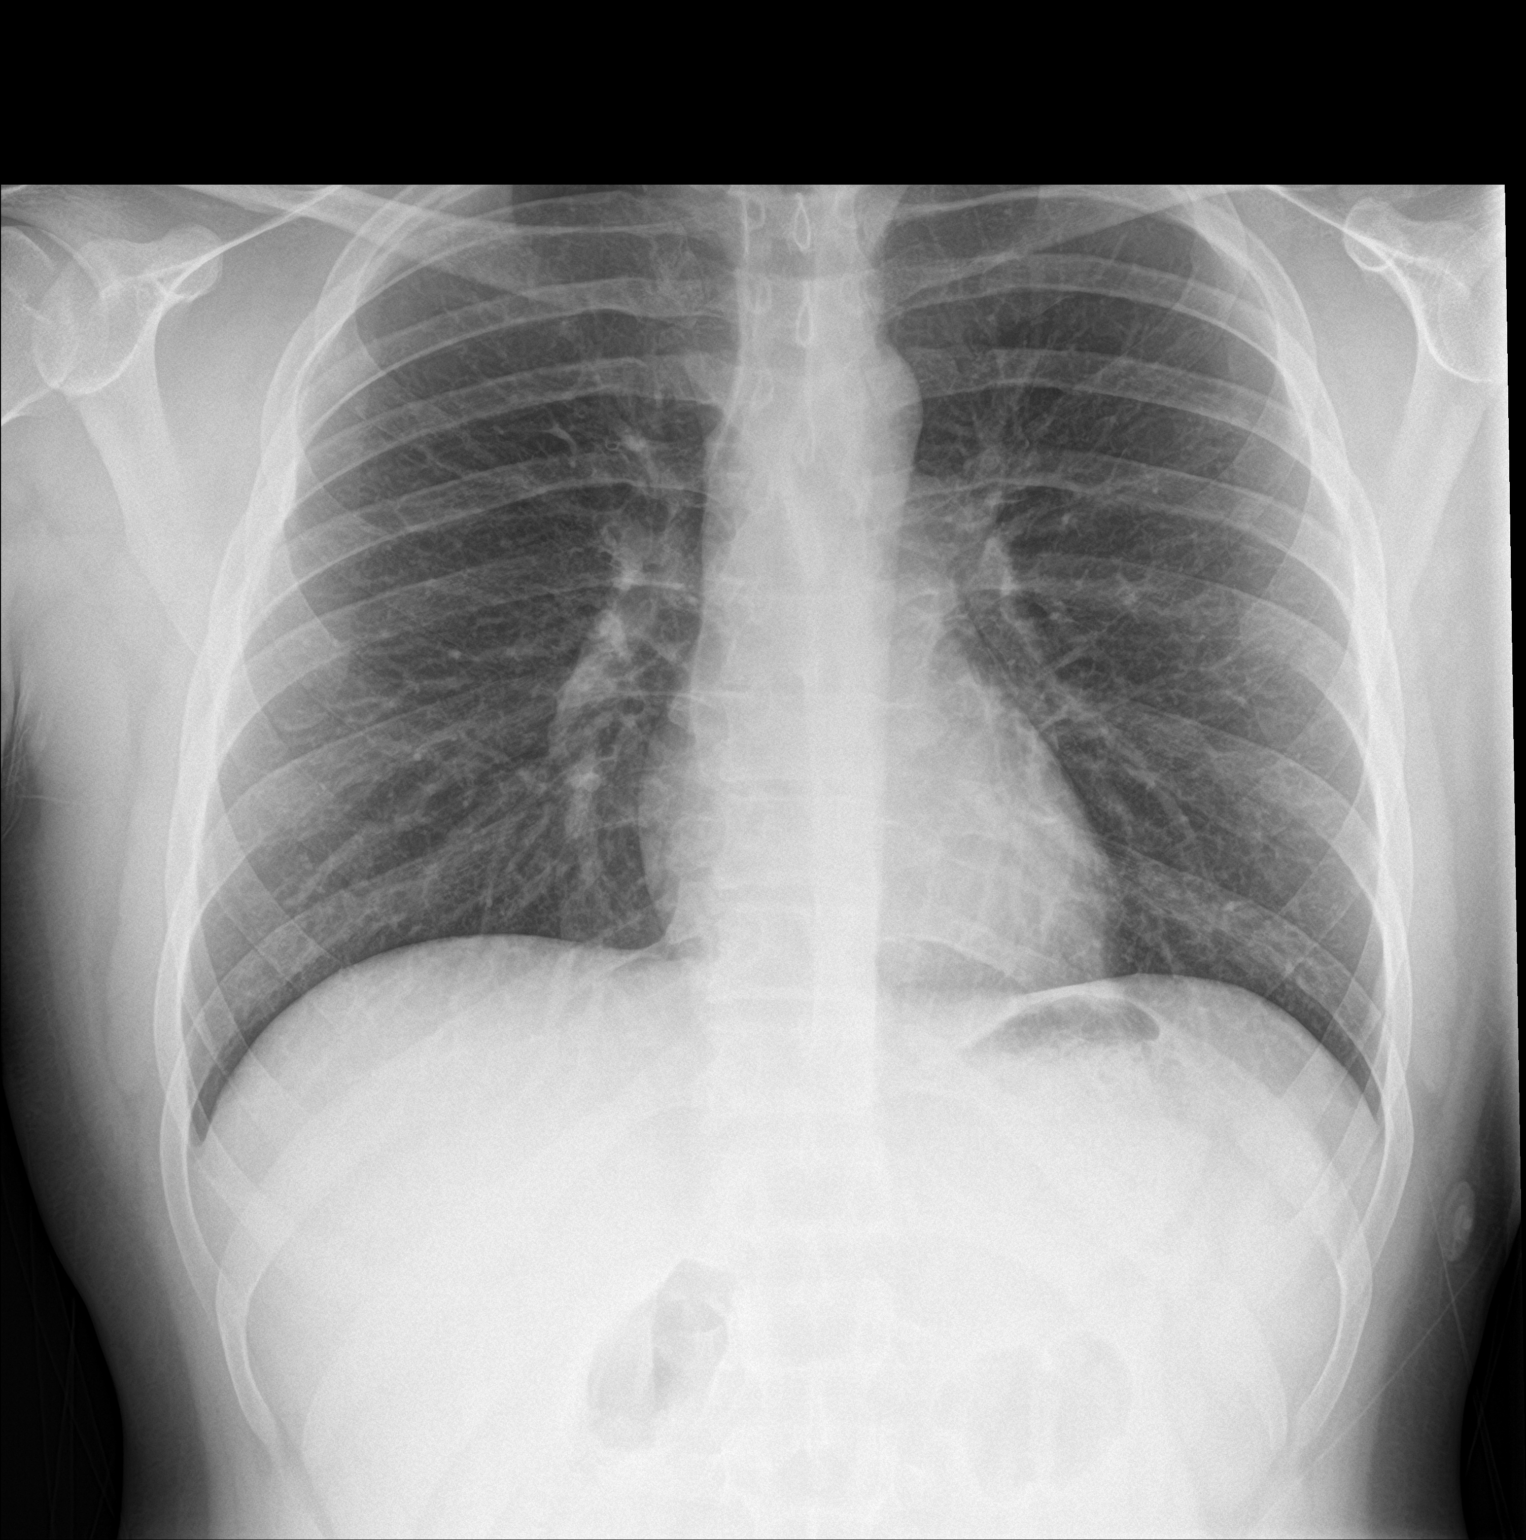

[chest lat]
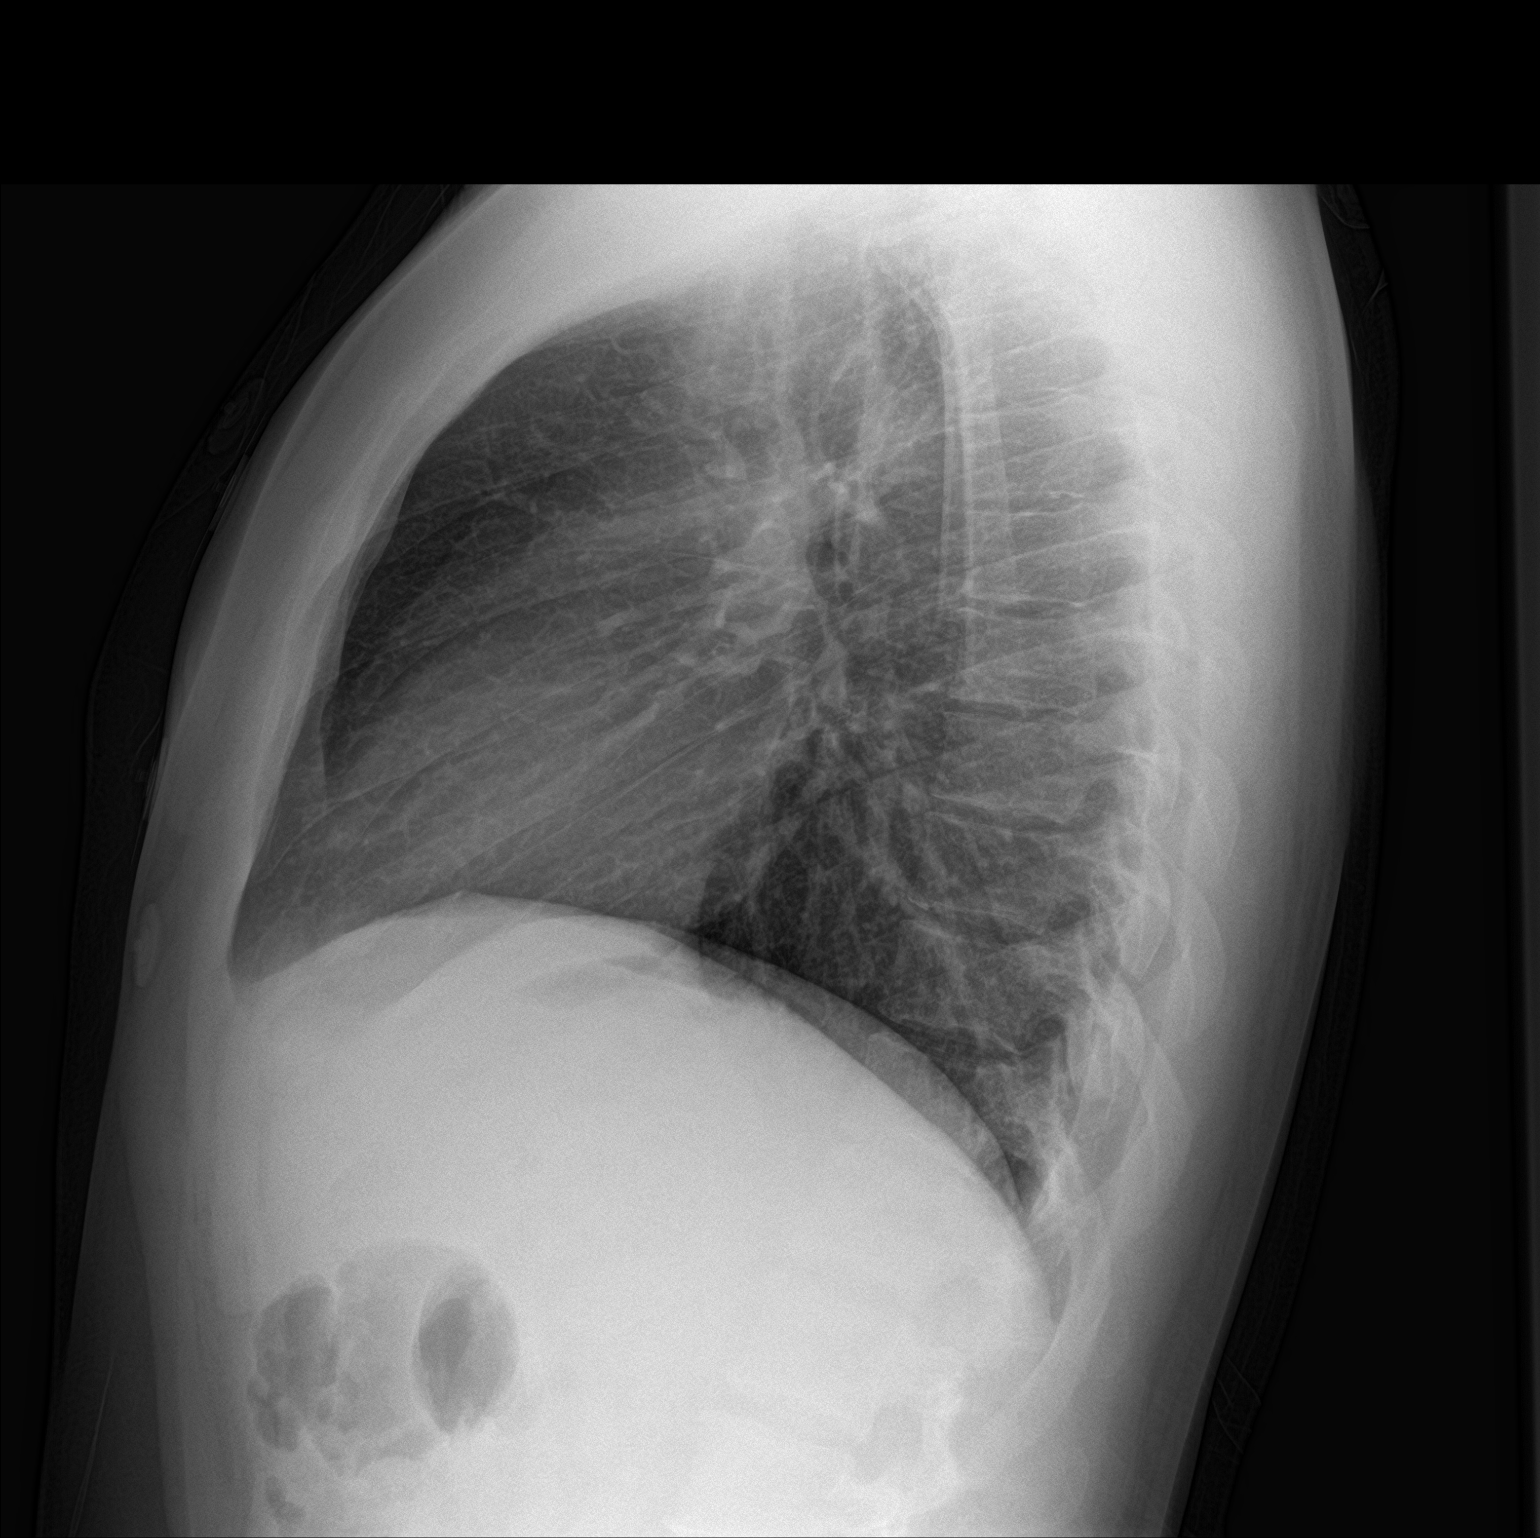

[2 of 2 positions shown; findings below may reference images not displayed]

FINDINGS: The heart size and mediastinal contours are within normal limits.
Both lungs are clear. The visualized skeletal structures are
unremarkable.
IMPRESSION: No active cardiopulmonary disease.
